# Patient Record
Sex: Female | Born: 1955 | Race: White | Hispanic: No | Marital: Married | Smoking: Never smoker
Health system: Southern US, Community
[De-identification: ages and names within clinical notes are randomized; demographics above are authoritative.]

## PROBLEM LIST (undated history)

## (undated) DIAGNOSIS — Z9889 Other specified postprocedural states: Secondary | ICD-10-CM

## (undated) DIAGNOSIS — G8929 Other chronic pain: Secondary | ICD-10-CM

## (undated) DIAGNOSIS — G629 Polyneuropathy, unspecified: Secondary | ICD-10-CM

---

## 2005-09-23 ENCOUNTER — Inpatient Hospital Stay (HOSPITAL_COMMUNITY): Admission: AD | Admit: 2005-09-23 | Discharge: 2005-09-23 | Payer: Self-pay | Admitting: *Deleted

## 2014-02-25 ENCOUNTER — Other Ambulatory Visit (HOSPITAL_BASED_OUTPATIENT_CLINIC_OR_DEPARTMENT_OTHER): Payer: Self-pay | Admitting: Podiatry

## 2014-02-25 DIAGNOSIS — R52 Pain, unspecified: Secondary | ICD-10-CM

## 2014-02-26 ENCOUNTER — Ambulatory Visit (HOSPITAL_BASED_OUTPATIENT_CLINIC_OR_DEPARTMENT_OTHER)
Admission: RE | Admit: 2014-02-26 | Discharge: 2014-02-26 | Disposition: A | Discharge status: Home / Self Care | Payer: BC Managed Care – PPO | Source: Ambulatory Visit | Attending: Podiatry | Admitting: Podiatry

## 2014-02-26 DIAGNOSIS — R52 Pain, unspecified: Secondary | ICD-10-CM

## 2014-02-26 DIAGNOSIS — M25476 Effusion, unspecified foot: Secondary | ICD-10-CM | POA: Insufficient documentation

## 2014-02-26 DIAGNOSIS — M25473 Effusion, unspecified ankle: Secondary | ICD-10-CM | POA: Diagnosis not present

## 2014-02-26 DIAGNOSIS — M25579 Pain in unspecified ankle and joints of unspecified foot: Secondary | ICD-10-CM | POA: Insufficient documentation

## 2014-04-13 DIAGNOSIS — M19071 Primary osteoarthritis, right ankle and foot: Secondary | ICD-10-CM

## 2017-05-06 ENCOUNTER — Other Ambulatory Visit: Payer: Self-pay | Admitting: Orthopaedic Surgery

## 2017-05-06 DIAGNOSIS — M4726 Other spondylosis with radiculopathy, lumbar region: Secondary | ICD-10-CM

## 2017-05-13 ENCOUNTER — Ambulatory Visit
Admission: RE | Admit: 2017-05-13 | Discharge: 2017-05-13 | Disposition: A | Discharge status: Home / Self Care | Payer: Medicare Other | Source: Ambulatory Visit | Attending: Orthopaedic Surgery | Admitting: Orthopaedic Surgery

## 2017-05-13 DIAGNOSIS — M4726 Other spondylosis with radiculopathy, lumbar region: Secondary | ICD-10-CM

## 2017-05-13 MED ORDER — DIAZEPAM 5 MG PO TABS
10.0000 mg | ORAL_TABLET | Freq: Once | ORAL | Status: AC
  Administered 2017-05-13: 5 mg via ORAL

## 2017-05-13 MED ORDER — IOPAMIDOL (ISOVUE-M 200) INJECTION 41%
15.0000 mL | Freq: Once | INTRAMUSCULAR | Status: AC
  Administered 2017-05-13: 15 mL via INTRATHECAL

## 2017-05-13 NOTE — Discharge Instructions (Signed)

## 2019-08-03 ENCOUNTER — Telehealth: Payer: Self-pay | Admitting: Nurse Practitioner

## 2019-08-03 ENCOUNTER — Other Ambulatory Visit: Payer: Self-pay | Admitting: Rehabilitation

## 2019-08-03 DIAGNOSIS — M5126 Other intervertebral disc displacement, lumbar region: Secondary | ICD-10-CM

## 2019-08-03 NOTE — Telephone Encounter (Signed)
Phone call to patient to verify medication list and allergies for myelogram procedure. Pt aware she will not need to hold any medications for this procedure. Pre and post procedure instructions reviewed with pt. Pt verbalized understanding. 

## 2019-08-11 ENCOUNTER — Ambulatory Visit
Admission: RE | Admit: 2019-08-11 | Discharge: 2019-08-11 | Disposition: A | Discharge status: Home / Self Care | Payer: Medicare Other | Source: Ambulatory Visit | Attending: Rehabilitation | Admitting: Rehabilitation

## 2019-08-11 DIAGNOSIS — M5126 Other intervertebral disc displacement, lumbar region: Secondary | ICD-10-CM

## 2019-08-11 MED ORDER — IOPAMIDOL (ISOVUE-M 200) INJECTION 41%
18.0000 mL | Freq: Once | INTRAMUSCULAR | Status: AC
  Administered 2019-08-11: 11:00:00 18 mL via INTRATHECAL

## 2019-08-11 MED ORDER — DIAZEPAM 5 MG PO TABS
5.0000 mg | ORAL_TABLET | Freq: Once | ORAL | Status: AC
  Administered 2019-08-11: 5 mg via ORAL

## 2019-08-11 NOTE — Discharge Instructions (Signed)

## 2019-09-09 ENCOUNTER — Ambulatory Visit: Payer: Medicare Other | Attending: Internal Medicine

## 2019-09-09 DIAGNOSIS — Z23 Encounter for immunization: Secondary | ICD-10-CM | POA: Insufficient documentation

## 2019-09-09 NOTE — Progress Notes (Signed)
   Covid-19 Vaccination Clinic  Name:  Debra Leonard    MRN: [id number] DOB: [birth year]  09/09/2019  Debra Leonard was observed post Covid-19 immunization for 15 minutes without incident. She was provided with Vaccine Information Sheet and instruction to access the V-Safe system.   Debra Leonard was instructed to call 911 with any severe reactions post vaccine: Marland Kitchen Difficulty breathing  . Swelling of face and throat  . A fast heartbeat  . A bad rash all over body  . Dizziness and weakness   Immunizations Administered    Name Date Dose VIS Date Route   Pfizer COVID-19 Vaccine 09/09/2019 11:53 AM 0.3 mL 06/18/2019 Intramuscular   Manufacturer: [employer], [employer]   Lot: [id number]   NDC: [address]-2

## 2019-10-05 ENCOUNTER — Ambulatory Visit: Payer: Medicare Other | Attending: Internal Medicine

## 2019-10-05 DIAGNOSIS — Z23 Encounter for immunization: Secondary | ICD-10-CM

## 2019-10-05 NOTE — Progress Notes (Signed)
   Covid-19 Vaccination Clinic  Name:  Debra Leonard    MRN: [id number] DOB: 1956/04/11  10/05/2019  Debra Leonard was observed post Covid-19 immunization for 15 minutes without incident. She was provided with Vaccine Information Sheet and instruction to access the V-Safe system.   Debra Leonard was instructed to call 911 with any severe reactions post vaccine: Marland Kitchen Difficulty breathing  . Swelling of face and throat  . A fast heartbeat  . A bad rash all over body  . Dizziness and weakness   Immunizations Administered    Name Date Dose VIS Date Route   Pfizer COVID-19 Vaccine 10/05/2019  2:45 PM 0.3 mL 06/18/2019 Intramuscular   Manufacturer: [employer], [employer]   Lot: [id number]   NDC: [address]-9

## 2021-01-26 ENCOUNTER — Encounter (HOSPITAL_BASED_OUTPATIENT_CLINIC_OR_DEPARTMENT_OTHER): Payer: Self-pay | Admitting: Emergency Medicine

## 2021-01-26 ENCOUNTER — Other Ambulatory Visit: Payer: Self-pay

## 2021-01-26 ENCOUNTER — Emergency Department (HOSPITAL_BASED_OUTPATIENT_CLINIC_OR_DEPARTMENT_OTHER): Payer: Medicare Other

## 2021-01-26 ENCOUNTER — Emergency Department (HOSPITAL_BASED_OUTPATIENT_CLINIC_OR_DEPARTMENT_OTHER)
Admission: EM | Admit: 2021-01-26 | Discharge: 2021-01-26 | Disposition: A | Discharge status: Home / Self Care | Payer: Medicare Other | Attending: Emergency Medicine | Admitting: Emergency Medicine

## 2021-01-26 DIAGNOSIS — M542 Cervicalgia: Secondary | ICD-10-CM | POA: Diagnosis not present

## 2021-01-26 DIAGNOSIS — M25512 Pain in left shoulder: Secondary | ICD-10-CM | POA: Insufficient documentation

## 2021-01-26 DIAGNOSIS — W108XXA Fall (on) (from) other stairs and steps, initial encounter: Secondary | ICD-10-CM | POA: Insufficient documentation

## 2021-01-26 DIAGNOSIS — S0990XA Unspecified injury of head, initial encounter: Secondary | ICD-10-CM | POA: Diagnosis present

## 2021-01-26 DIAGNOSIS — W19XXXA Unspecified fall, initial encounter: Secondary | ICD-10-CM

## 2021-01-26 DIAGNOSIS — S0003XA Contusion of scalp, initial encounter: Secondary | ICD-10-CM | POA: Insufficient documentation

## 2021-01-26 DIAGNOSIS — S50312A Abrasion of left elbow, initial encounter: Secondary | ICD-10-CM | POA: Diagnosis not present

## 2021-01-26 HISTORY — DX: Other specified postprocedural states: Z98.890

## 2021-01-26 HISTORY — DX: Polyneuropathy, unspecified: G62.9

## 2021-01-26 HISTORY — DX: Other chronic pain: G89.29

## 2021-01-26 MED ORDER — TRAMADOL HCL 50 MG PO TABS: 50.0000 mg | ORAL_TABLET | Freq: Four times a day (QID) | ORAL | 0 refills | Status: AC | PRN

## 2021-01-26 MED ORDER — TRAMADOL HCL 50 MG PO TABS
50.0000 mg | ORAL_TABLET | Freq: Once | ORAL | Status: AC
  Administered 2021-01-26: 50 mg via ORAL
  Filled 2021-01-26: qty 1

## 2021-01-26 NOTE — Discharge Instructions (Addendum)
Take tramadol as needed for pain.  Take Tylenol for pain  Your x-rays and CT scan did not show any fractures  See your doctor for follow-up  Return to ER if you have worse headaches, elbow pain, uncontrolled bleeding, vomiting.

## 2021-01-26 NOTE — ED Provider Notes (Signed)
[facility] EMERGENCY DEPARTMENT Provider Note   CSN: [id number] Arrival date & time: 01/26/21  1753     History Chief Complaint  Patient presents with  . Fall    Debra Leonard is a 65 y.o. female history of chronic back pain, here presenting with fall.  Patient was carrying a washer downstairs with a friend and the washer fell apart and she fell down 15 steps.  She did hit her head at that time. She is complaining of left shoulder pain and also abrasion of the left elbow.  No meds prior to arrival.  Patient is on blood thinners.  States that her tetanus is up-to-date  The history is provided by the patient.      Past Medical History:  Diagnosis Date  . Chronic back pain   . History of lithotripsy   . Neuropathy     There are no problems to display for this patient.  OB History   No obstetric history on file.     No family history on file.  Social History   Tobacco Use  . Smoking status: Never    Passive exposure: Never  . Smokeless tobacco: Never  Substance Use Topics  . Alcohol use: Yes    Alcohol/week: 2.0 standard drinks    Types: 2 Cans of beer per week    Comment: weekly  . Drug use: Never    Home Medications Prior to Admission medications   Medication Sig Start Date End Date Taking? Authorizing Provider  acetaminophen (TYLENOL) 500 MG tablet Take 1,000 mg by mouth every 6 (six) hours as needed.    [provider]  estradiol (ESTRACE) 2 MG tablet Take 2 mg by mouth daily.    [provider]  ezetimibe (ZETIA) 10 MG tablet Take 10 mg by mouth daily.    [provider]  gabapentin (NEURONTIN) 800 MG tablet Take 800 mg by mouth 3 (three) times daily.    [provider]  omeprazole (PRILOSEC) 40 MG capsule Take 40 mg by mouth daily.    [provider]    Allergies    Latex, Wasp venom protein, Betadine [povidone iodine], Codeine, Hyoscyamine sulfate, Lipitor [atorvastatin], Shellfish allergy, and Sulfa  antibiotics  Review of Systems   Review of Systems  Musculoskeletal:        L elbow and shoulder pain   Skin:  Positive for wound.  All other systems reviewed and are negative.  Physical Exam Updated Vital Signs BP 124/88   Pulse 68   Temp 98.6 F (37 C) (Oral)   Resp 18   Ht 5\' 3"  (1.6 m)   Wt 74.8 kg   SpO2 95%   BMI 29.23 kg/m   Physical Exam Vitals and nursing note reviewed.  Constitutional:      Comments: Slightly uncomfortable.  HENT:     Head: Normocephalic.     Comments: Bruising on the left scalp with no obvious hematoma or laceration    Nose: Nose normal.     Mouth/Throat:     Mouth: Mucous membranes are moist.  Eyes:     Extraocular Movements: Extraocular movements intact.     Pupils: Pupils are equal, round, and reactive to light.  Neck:     Comments: Mild left paracervical tenderness Cardiovascular:     Rate and Rhythm: Normal rate.     Pulses: Normal pulses.     Heart sounds: Normal heart sounds.  Pulmonary:     Effort: Pulmonary effort is normal.  Breath sounds: Normal breath sounds.  Abdominal:     General: Abdomen is flat.     Palpations: Abdomen is soft.  Musculoskeletal:     Cervical back: Normal range of motion.     Comments: Abrasion of the left elbow.  Mild tenderness over the elbow.  Mild tenderness over the left shoulder but normal range of motion of the shoulder.  No obvious other extremity trauma.  Patient has no midline spinal tenderness  Skin:    General: Skin is warm.     Capillary Refill: Capillary refill takes less than 2 seconds.  Neurological:     General: No focal deficit present.     Mental Status: She is alert and oriented to person, place, and time.  Psychiatric:        Mood and Affect: Mood normal.        Behavior: Behavior normal.    ED Results / Procedures / Treatments   Labs (all labs ordered are listed, but only abnormal results are displayed) Labs Reviewed - No data to display  EKG None  Radiology DG  Ribs Unilateral W/Chest Left  Result Date: 01/26/2021 CLINICAL DATA:  65 year old female with fall and trauma to the left chest wall. EXAM: LEFT RIBS AND CHEST - 3+ VIEW COMPARISON:  None. FINDINGS: The lungs are clear. There is no pleural effusion pneumothorax. The cardiac silhouette is within limits. No acute osseous pathology. No displaced rib fractures. Lower thoracic spinal stimulator. IMPRESSION: Negative. Electronically Signed   By: Elgie CollardArash  Radparvar M.D.   On: 01/26/2021 19:18   DG Elbow Complete Left  Result Date: 01/26/2021 CLINICAL DATA:  65 year old female with fall and trauma to the left upper extremity. EXAM: LEFT SHOULDER - 2+ VIEW; LEFT ELBOW - COMPLETE 3+ VIEW COMPARISON:  Chest radiograph dated 01/26/2021. FINDINGS: There is no evidence of fracture or dislocation. There is no evidence of arthropathy or other focal bone abnormality. Soft tissues are unremarkable. IMPRESSION: Negative. Electronically Signed   By: Elgie CollardArash  Radparvar M.D.   On: 01/26/2021 19:17   CT Head Wo Contrast  Result Date: 01/26/2021 CLINICAL DATA:  Facial trauma.  Fall down concrete steps. EXAM: CT HEAD WITHOUT CONTRAST CT MAXILLOFACIAL WITHOUT CONTRAST CT CERVICAL SPINE WITHOUT CONTRAST TECHNIQUE: Multidetector CT imaging of the head, cervical spine, and maxillofacial structures were performed using the standard protocol without intravenous contrast. Multiplanar CT image reconstructions of the cervical spine and maxillofacial structures were also generated. COMPARISON:  Thoracic spine CT 10/28/2017 FINDINGS: CT HEAD FINDINGS Brain: There is no evidence of an acute infarct, intracranial hemorrhage, mass, midline shift, or extra-axial fluid collection. The ventricles and sulci are normal. Vascular: Mild calcified atherosclerosis at the skull base. No hyperdense vessel. Skull: No fracture or suspicious osseous lesion. Other: None. CT MAXILLOFACIAL FINDINGS Osseous: No acute fracture, mandibular dislocation, or destructive  osseous process. Orbits: Unremarkable. Sinuses: Paranasal sinuses and mastoid air cells are clear. Soft tissues: Unremarkable. CT CERVICAL SPINE FINDINGS Alignment: Cervical spine straightening.  No traumatic subluxation. Skull base and vertebrae: No acute fracture or suspicious osseous lesion. 5 mm lucent focus in the C7 vertebral body, unchanged from 2019 and likely benign. Soft tissues and spinal canal: No prevertebral fluid or swelling. No visible canal hematoma. Disc levels: Moderate disc degeneration at C5-6 asymmetric to the right and at C6-7 asymmetric to the left. Moderate right neural foraminal stenosis at C5-6 and mild left neural foraminal stenosis at C6-7 due to uncovertebral spurring. No evidence of significant spinal stenosis. Other: None. IMPRESSION: 1. Negative  head CT. 2. No acute maxillofacial or cervical spine fracture. Electronically Signed   By: Sebastian Ache M.D.   On: 01/26/2021 19:06   CT Cervical Spine Wo Contrast  Result Date: 01/26/2021 CLINICAL DATA:  Facial trauma.  Fall down concrete steps. EXAM: CT HEAD WITHOUT CONTRAST CT MAXILLOFACIAL WITHOUT CONTRAST CT CERVICAL SPINE WITHOUT CONTRAST TECHNIQUE: Multidetector CT imaging of the head, cervical spine, and maxillofacial structures were performed using the standard protocol without intravenous contrast. Multiplanar CT image reconstructions of the cervical spine and maxillofacial structures were also generated. COMPARISON:  Thoracic spine CT 10/28/2017 FINDINGS: CT HEAD FINDINGS Brain: There is no evidence of an acute infarct, intracranial hemorrhage, mass, midline shift, or extra-axial fluid collection. The ventricles and sulci are normal. Vascular: Mild calcified atherosclerosis at the skull base. No hyperdense vessel. Skull: No fracture or suspicious osseous lesion. Other: None. CT MAXILLOFACIAL FINDINGS Osseous: No acute fracture, mandibular dislocation, or destructive osseous process. Orbits: Unremarkable. Sinuses: Paranasal  sinuses and mastoid air cells are clear. Soft tissues: Unremarkable. CT CERVICAL SPINE FINDINGS Alignment: Cervical spine straightening.  No traumatic subluxation. Skull base and vertebrae: No acute fracture or suspicious osseous lesion. 5 mm lucent focus in the C7 vertebral body, unchanged from 2019 and likely benign. Soft tissues and spinal canal: No prevertebral fluid or swelling. No visible canal hematoma. Disc levels: Moderate disc degeneration at C5-6 asymmetric to the right and at C6-7 asymmetric to the left. Moderate right neural foraminal stenosis at C5-6 and mild left neural foraminal stenosis at C6-7 due to uncovertebral spurring. No evidence of significant spinal stenosis. Other: None. IMPRESSION: 1. Negative head CT. 2. No acute maxillofacial or cervical spine fracture. Electronically Signed   By: Sebastian Ache M.D.   On: 01/26/2021 19:06   DG Shoulder Left  Result Date: 01/26/2021 CLINICAL DATA:  65 year old female with fall and trauma to the left upper extremity. EXAM: LEFT SHOULDER - 2+ VIEW; LEFT ELBOW - COMPLETE 3+ VIEW COMPARISON:  Chest radiograph dated 01/26/2021. FINDINGS: There is no evidence of fracture or dislocation. There is no evidence of arthropathy or other focal bone abnormality. Soft tissues are unremarkable. IMPRESSION: Negative. Electronically Signed   By: Elgie Collard M.D.   On: 01/26/2021 19:17   CT Maxillofacial Wo Contrast  Result Date: 01/26/2021 CLINICAL DATA:  Facial trauma.  Fall down concrete steps. EXAM: CT HEAD WITHOUT CONTRAST CT MAXILLOFACIAL WITHOUT CONTRAST CT CERVICAL SPINE WITHOUT CONTRAST TECHNIQUE: Multidetector CT imaging of the head, cervical spine, and maxillofacial structures were performed using the standard protocol without intravenous contrast. Multiplanar CT image reconstructions of the cervical spine and maxillofacial structures were also generated. COMPARISON:  Thoracic spine CT 10/28/2017 FINDINGS: CT HEAD FINDINGS Brain: There is no evidence  of an acute infarct, intracranial hemorrhage, mass, midline shift, or extra-axial fluid collection. The ventricles and sulci are normal. Vascular: Mild calcified atherosclerosis at the skull base. No hyperdense vessel. Skull: No fracture or suspicious osseous lesion. Other: None. CT MAXILLOFACIAL FINDINGS Osseous: No acute fracture, mandibular dislocation, or destructive osseous process. Orbits: Unremarkable. Sinuses: Paranasal sinuses and mastoid air cells are clear. Soft tissues: Unremarkable. CT CERVICAL SPINE FINDINGS Alignment: Cervical spine straightening.  No traumatic subluxation. Skull base and vertebrae: No acute fracture or suspicious osseous lesion. 5 mm lucent focus in the C7 vertebral body, unchanged from 2019 and likely benign. Soft tissues and spinal canal: No prevertebral fluid or swelling. No visible canal hematoma. Disc levels: Moderate disc degeneration at C5-6 asymmetric to the right and at C6-7 asymmetric to  the left. Moderate right neural foraminal stenosis at C5-6 and mild left neural foraminal stenosis at C6-7 due to uncovertebral spurring. No evidence of significant spinal stenosis. Other: None. IMPRESSION: 1. Negative head CT. 2. No acute maxillofacial or cervical spine fracture. Electronically Signed   By: Sebastian Ache M.D.   On: 01/26/2021 19:06    Procedures Procedures   Medications Ordered in ED Medications  traMADol (ULTRAM) tablet 50 mg (50 mg Oral Given 01/26/21 1823)    ED Course  I have reviewed the triage vital signs and the nursing notes.  Pertinent labs & imaging results that were available during my care of the patient were reviewed by me and considered in my medical decision making (see chart for details).    MDM Rules/Calculators/A&P                           Messiah Rovira is a 65 y.o. female here with fall down 15 steps.  Patient has an abrasion of the left elbow.  Patient also had head injury at that time.  Patient otherwise has no signs of chest or  abdominal trauma.  We will get CT head and left upper extremity x-ray  8:39 PM CT did not show any fractures or bleed.  X-rays unremarkable.  Felt better after tramadol.  Stable for discharge  Final Clinical Impression(s) / ED Diagnoses Final diagnoses:  None    Rx / DC Orders ED Discharge Orders     None        Charlynne Pander, MD 01/26/21 2040

## 2021-01-26 NOTE — ED Triage Notes (Signed)
Reports she fell down 15 concrete steps.  Landed on left elbow.  C/o pain to left elbow and left shoulder.  Also has abrasion to right leg.  Ambulatory in triage.  Denies any LOC.

## 2021-01-26 NOTE — ED Provider Notes (Signed)
Emergency Medicine Provider Triage Evaluation Note  Debra Leonard , a 65 y.o. female  was evaluated in triage.  Pt complains of fall.  Patient states that she was carrying a washer downstairs with a friend and the washer fell apart and she fell down about 15 steps.  She states that she is having pain in the left shoulder and elbow area.  Also hit her head and complains of some blurry vision  Review of Systems  Positive: Blurry vision, L elbow pain  Negative: Back pain, chest pain   Physical Exam  BP 129/82 (BP Location: Right Arm)   Pulse 84   Temp 98.6 F (37 C) (Oral)   Resp 18   Ht 5\' 3"  (1.6 m)   Wt 74.8 kg   SpO2 97%   BMI 29.23 kg/m  Gen:   Awake, no distress   Resp:  Normal effort  MSK:   Bruising of the left elbow area, tenderness to the left shoulder but able to range it.  Abrasion to the right leg Other:    Medical Decision Making  Medically screening exam initiated at 6:22 PM.  Appropriate orders placed.  [name] was informed that the remainder of the evaluation will be completed by another provider, this initial triage assessment does not replace that evaluation, and the importance of remaining in the ED until their evaluation is complete.  Patient is here with fall.  Will get CT head neck and face and also left upper extremity x-rays.  No spinal tenderness. Ambulatory    [name], MD 01/26/21 01/28/21

## 2022-02-07 ENCOUNTER — Other Ambulatory Visit: Payer: Self-pay | Admitting: Rehabilitation

## 2022-02-07 DIAGNOSIS — G894 Chronic pain syndrome: Secondary | ICD-10-CM

## 2022-02-07 DIAGNOSIS — M546 Pain in thoracic spine: Secondary | ICD-10-CM

## 2022-02-14 ENCOUNTER — Ambulatory Visit
Admission: RE | Admit: 2022-02-14 | Discharge: 2022-02-14 | Disposition: A | Discharge status: Home / Self Care | Payer: Medicare Other | Source: Ambulatory Visit | Attending: Rehabilitation | Admitting: Rehabilitation

## 2022-02-14 DIAGNOSIS — G894 Chronic pain syndrome: Secondary | ICD-10-CM

## 2022-02-14 DIAGNOSIS — M546 Pain in thoracic spine: Secondary | ICD-10-CM

## 2022-02-14 MED ORDER — IOPAMIDOL (ISOVUE-M 300) INJECTION 61%
10.0000 mL | Freq: Once | INTRAMUSCULAR | Status: AC
  Administered 2022-02-14: 10 mL via INTRATHECAL

## 2022-02-14 MED ORDER — MEPERIDINE HCL 50 MG/ML IJ SOLN: 50.0000 mg | Freq: Once | INTRAMUSCULAR | Status: DC | PRN

## 2022-02-14 MED ORDER — DIAZEPAM 5 MG PO TABS
5.0000 mg | ORAL_TABLET | Freq: Once | ORAL | Status: AC
  Administered 2022-02-14: 5 mg via ORAL

## 2022-02-14 MED ORDER — ONDANSETRON HCL 4 MG/2ML IJ SOLN: 4.0000 mg | Freq: Once | INTRAMUSCULAR | Status: DC | PRN

## 2022-02-14 MED ORDER — ACETAMINOPHEN 500 MG PO TABS
1000.0000 mg | ORAL_TABLET | Freq: Once | ORAL | Status: AC
  Administered 2022-02-14: 1000 mg via ORAL

## 2022-02-14 NOTE — Discharge Instr - Other Orders (Addendum)
11:29 Pain 8/10 post myelogram procedure, see MAR. 1,000 mg Tylenol given orally per pt request.

## 2022-02-14 NOTE — Discharge Instructions (Signed)

## 2022-02-14 NOTE — Progress Notes (Signed)
Pt reports her spinal cord stimulator has been placed in procedure mode for myelogram procedure.

## 2022-03-19 IMAGING — CT CT MAXILLOFACIAL W/O CM
3 series · 14 of 47 positions shown, 16 images · non-contrast
Comparison: Thoracic spine CT 10/28/2017

CLINICAL DATA: Facial trauma.  Fall down concrete steps.

EXAM:
CT HEAD WITHOUT CONTRAST
CT MAXILLOFACIAL WITHOUT CONTRAST
CT CERVICAL SPINE WITHOUT CONTRAST
TECHNIQUE: Multidetector CT imaging of the head, cervical spine, and
maxillofacial structures were performed using the standard protocol
without intravenous contrast. Multiplanar CT image reconstructions
of the cervical spine and maxillofacial structures were also
generated.

[Series 2: max soft · axial · 0.34mm/px · z∈[-266,-136]mm · 8 of 77 slices shown, 10 images]
[im 6/77  brain]
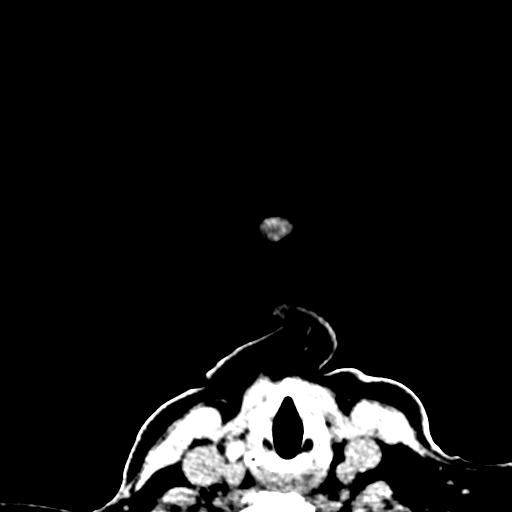
[im 6/77  bone]
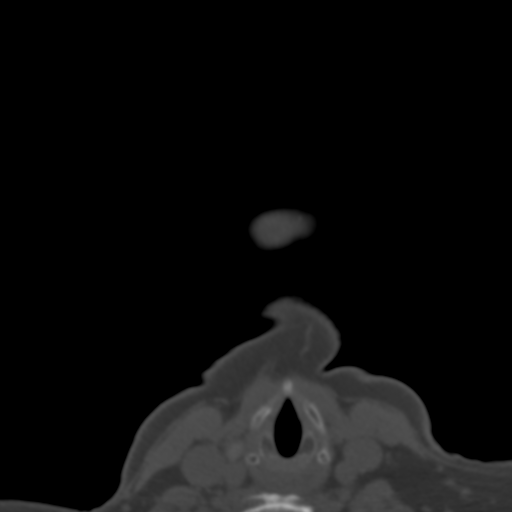
[im 16/77  bone]
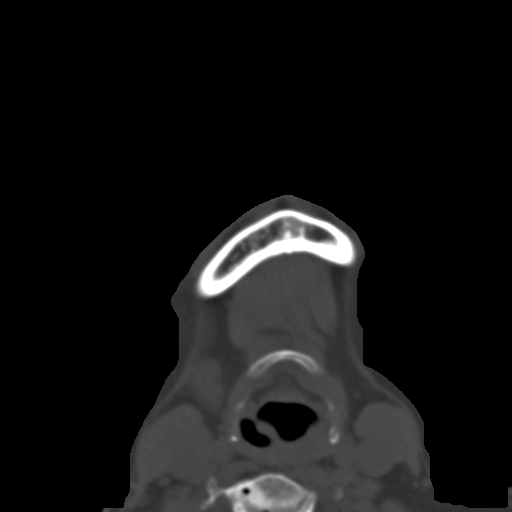
[im 24/77  bone]
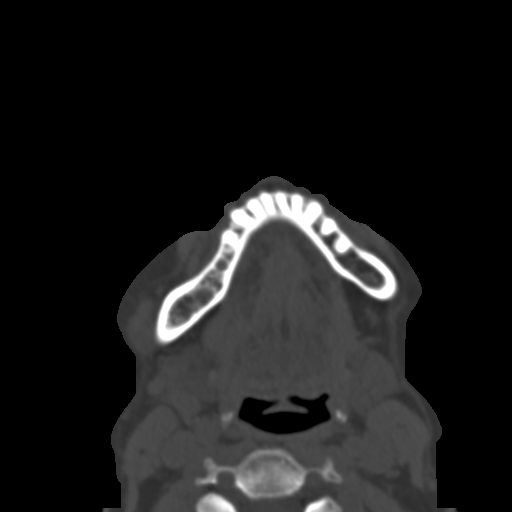
[im 35/77  bone]
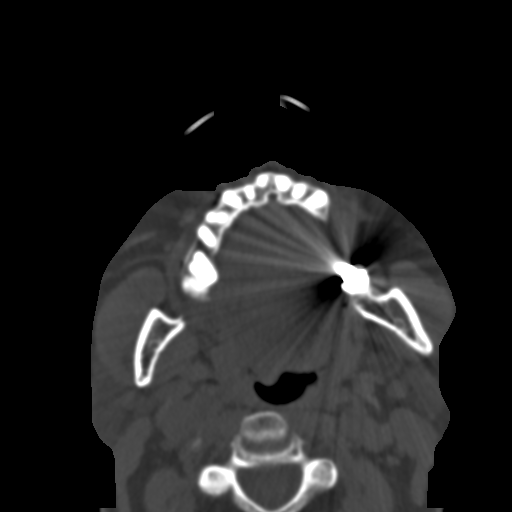
[im 42/77  brain]
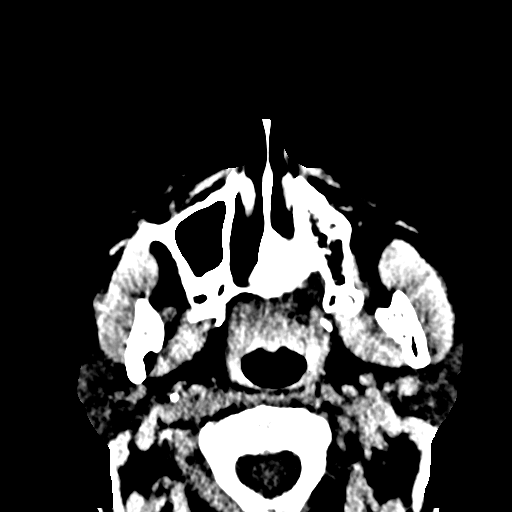
[im 42/77  bone]
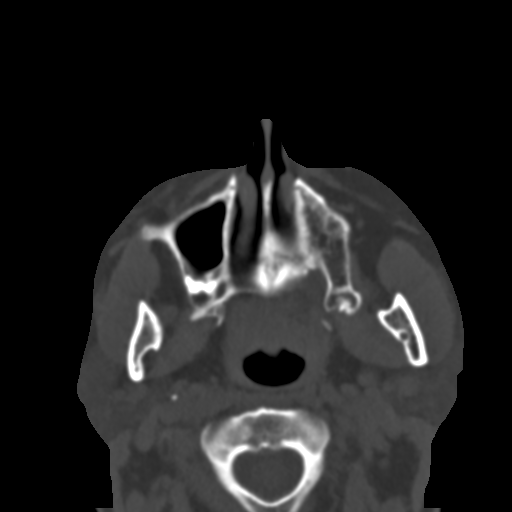
[im 53/77  bone]
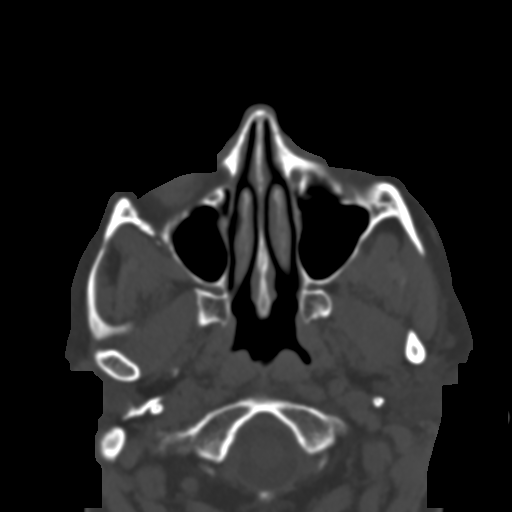
[im 61/77  bone]
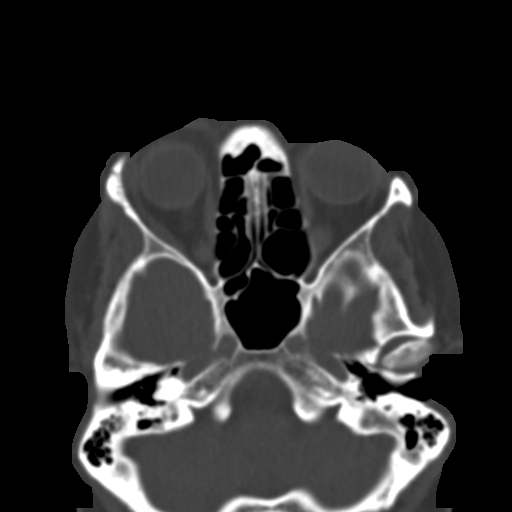
[im 71/77  bone]
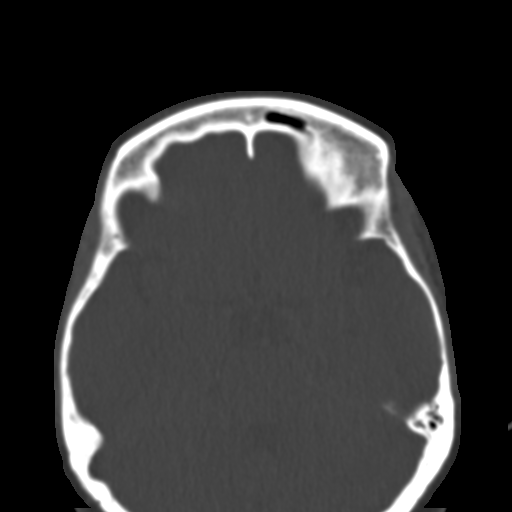

[Series 6: coronal soft · coronal · 0.34mm/px · 3 of 68 slices shown]
[im 23/68  bone]
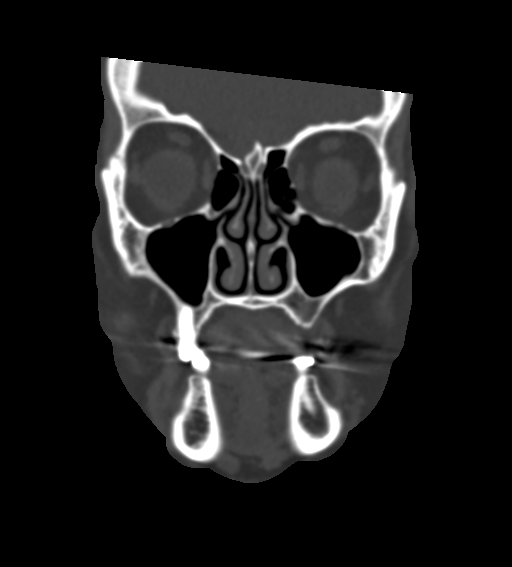
[im 30/68  bone]
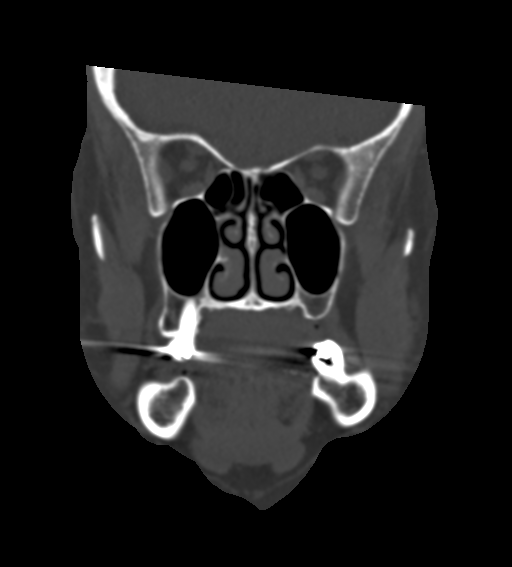
[im 38/68  bone]
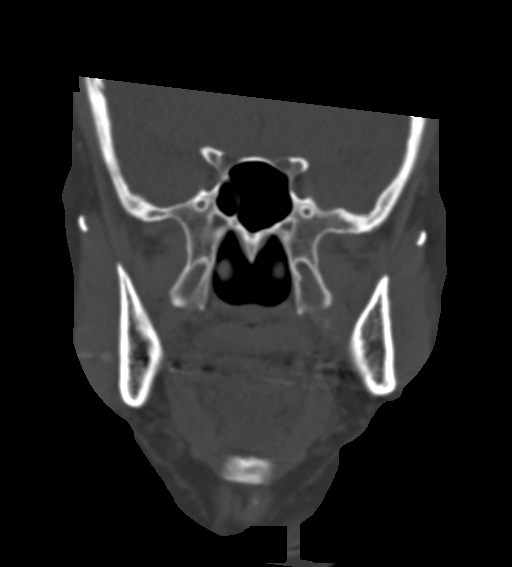

[Series 8: sagittal soft · sagittal · 0.26mm/px · 3 of 79 slices shown]
[im 27/79  bone]
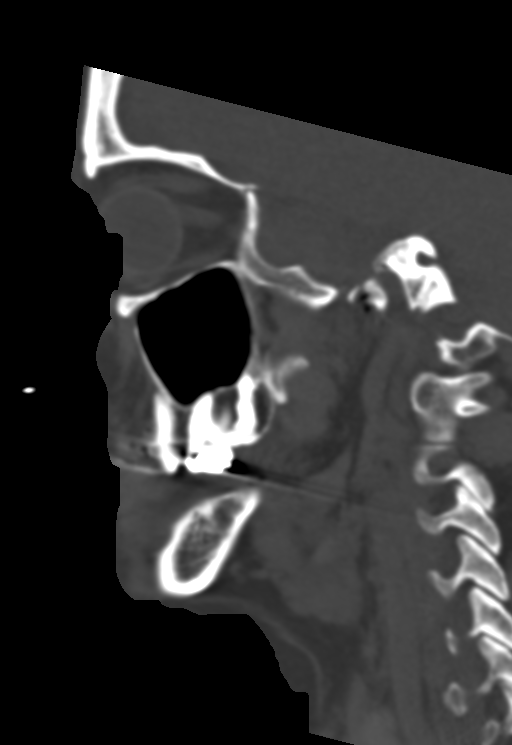
[im 40/79  bone]
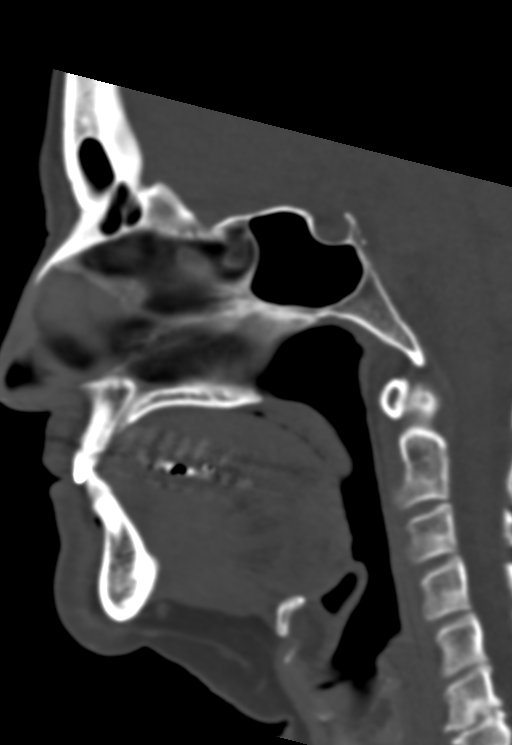
[im 53/79  bone]
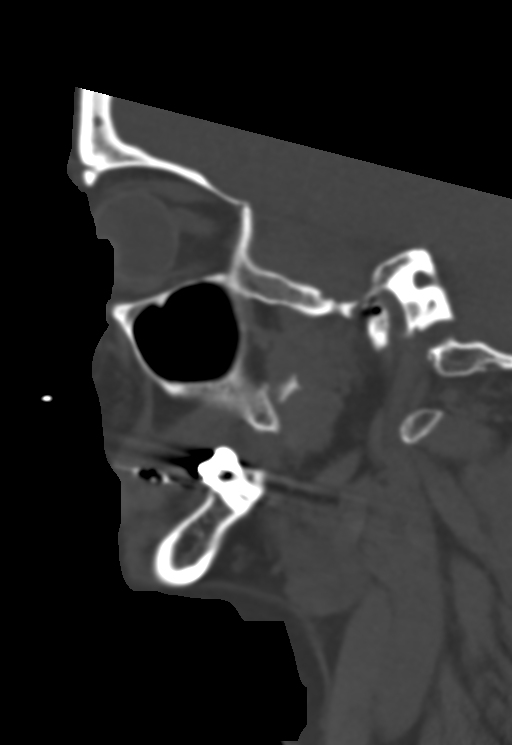

[14 of 47 positions shown; findings below may reference images not displayed]

FINDINGS: CT HEAD FINDINGS

Brain: There is no evidence of an acute infarct, intracranial
hemorrhage, mass, midline shift, or extra-axial fluid collection.
The ventricles and sulci are normal.

Vascular: Mild calcified atherosclerosis at the skull base. No
hyperdense vessel.

Skull: No fracture or suspicious osseous lesion.

Other: None.

CT MAXILLOFACIAL FINDINGS

Osseous: No acute fracture, mandibular dislocation, or destructive
osseous process.

Orbits: Unremarkable.

Sinuses: Paranasal sinuses and mastoid air cells are clear.

Soft tissues: Unremarkable.

CT CERVICAL SPINE FINDINGS

Alignment: Cervical spine straightening.  No traumatic subluxation.

Skull base and vertebrae: No acute fracture or suspicious osseous
lesion. 5 mm lucent focus in the C7 vertebral body, unchanged from
3025 and likely benign.

Soft tissues and spinal canal: No prevertebral fluid or swelling. No
visible canal hematoma.

Disc levels: Moderate disc degeneration at C5-6 asymmetric to the
right and at C6-7 asymmetric to the left. Moderate right neural
foraminal stenosis at C5-6 and mild left neural foraminal stenosis
at C6-7 due to uncovertebral spurring. No evidence of significant
spinal stenosis.

Other: None.
IMPRESSION: 1. Negative head CT.
2. No acute maxillofacial or cervical spine fracture.

## 2022-03-19 IMAGING — CT CT CERVICAL SPINE W/O CM
3 of 4 series · 10 of 33 positions shown, 12 images · non-contrast
Comparison: Thoracic spine CT 10/28/2017

CLINICAL DATA: Facial trauma.  Fall down concrete steps.

EXAM:
CT HEAD WITHOUT CONTRAST
CT MAXILLOFACIAL WITHOUT CONTRAST
CT CERVICAL SPINE WITHOUT CONTRAST
TECHNIQUE: Multidetector CT imaging of the head, cervical spine, and
maxillofacial structures were performed using the standard protocol
without intravenous contrast. Multiplanar CT image reconstructions
of the cervical spine and maxillofacial structures were also
generated.

[Series 5: sagittal bone · sagittal · 0.31mm/px · 5 of 59 slices shown, 6 images]
[im 20/59  bone]
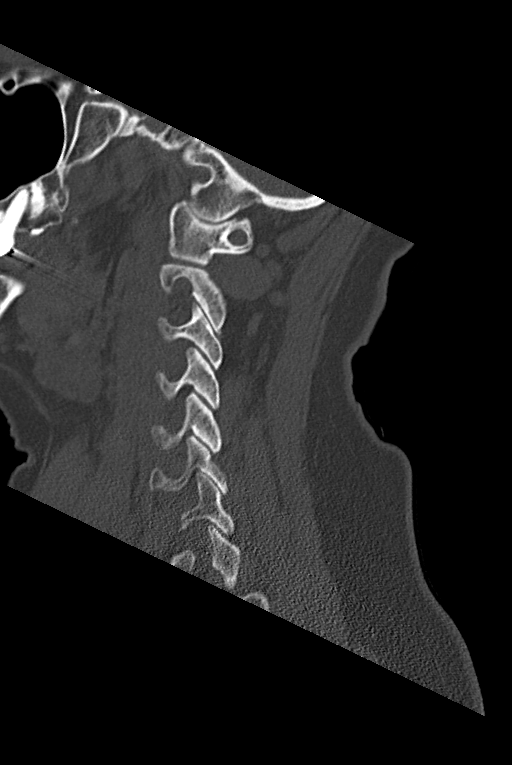
[im 25/59  bone]
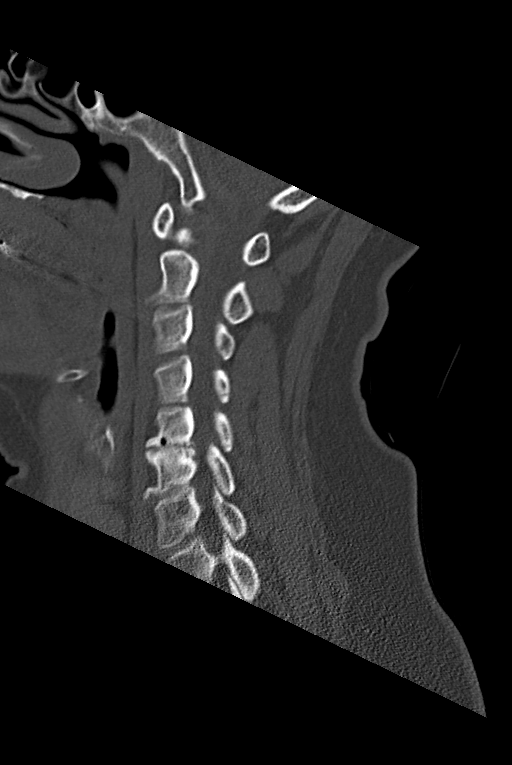
[im 30/59  soft-tissue]
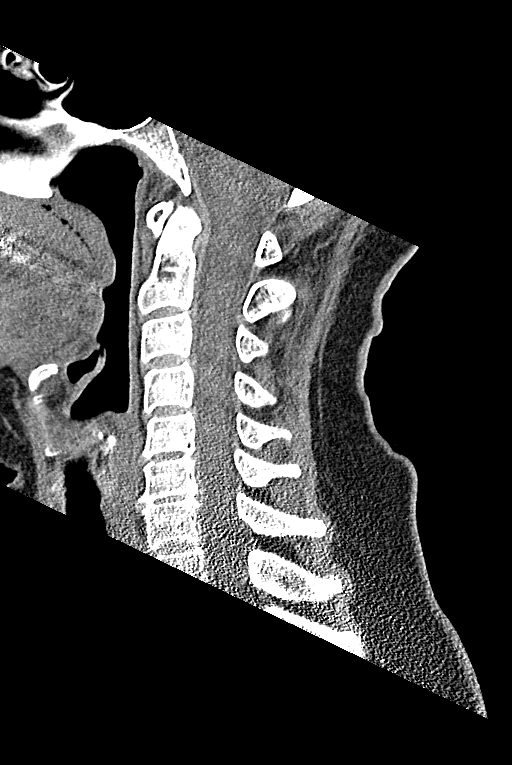
[im 30/59  bone]
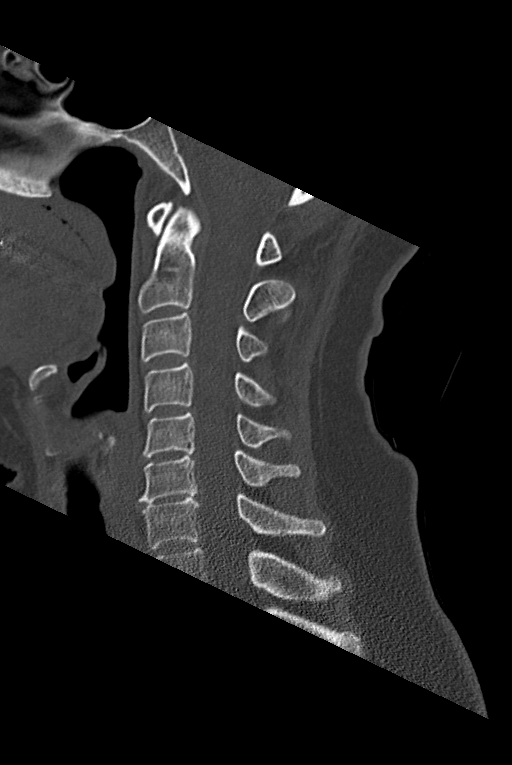
[im 34/59  bone]
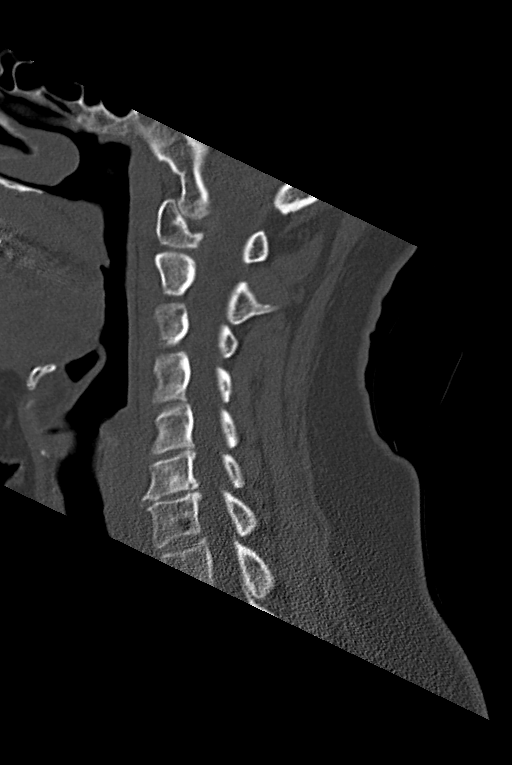
[im 39/59  bone]
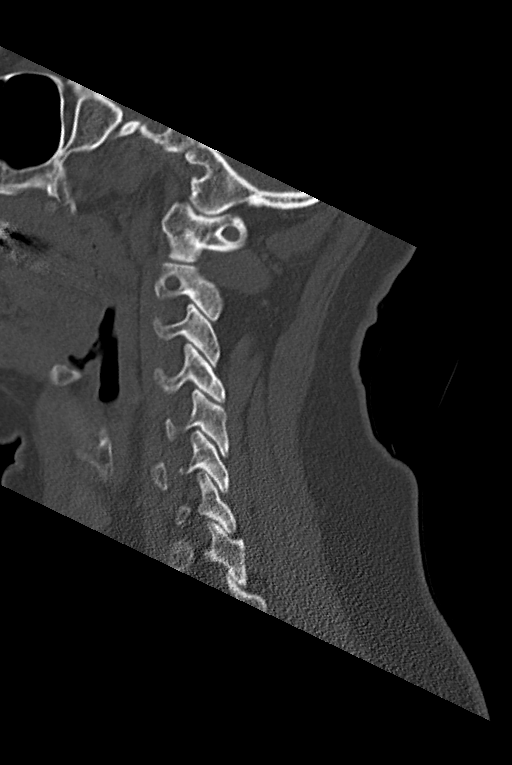

[Series 6: coronal bone · coronal · 0.23mm/px · 3 of 63 slices shown]
[im 15/63  bone]
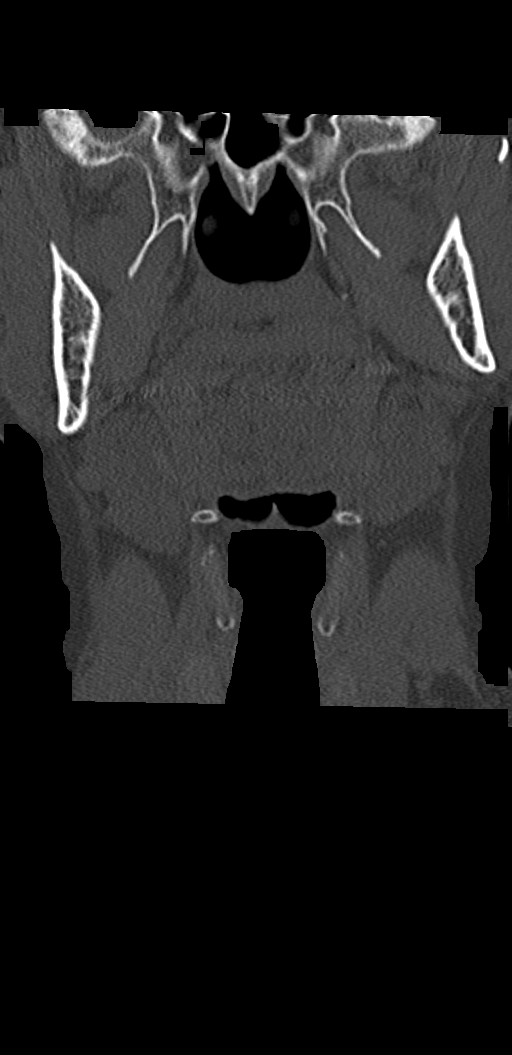
[im 26/63  bone]
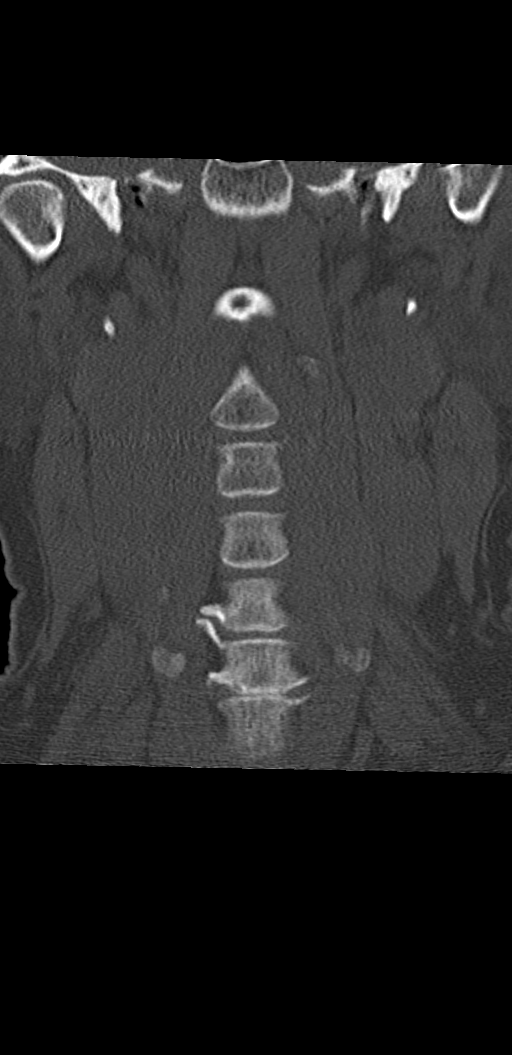
[im 37/63  bone]
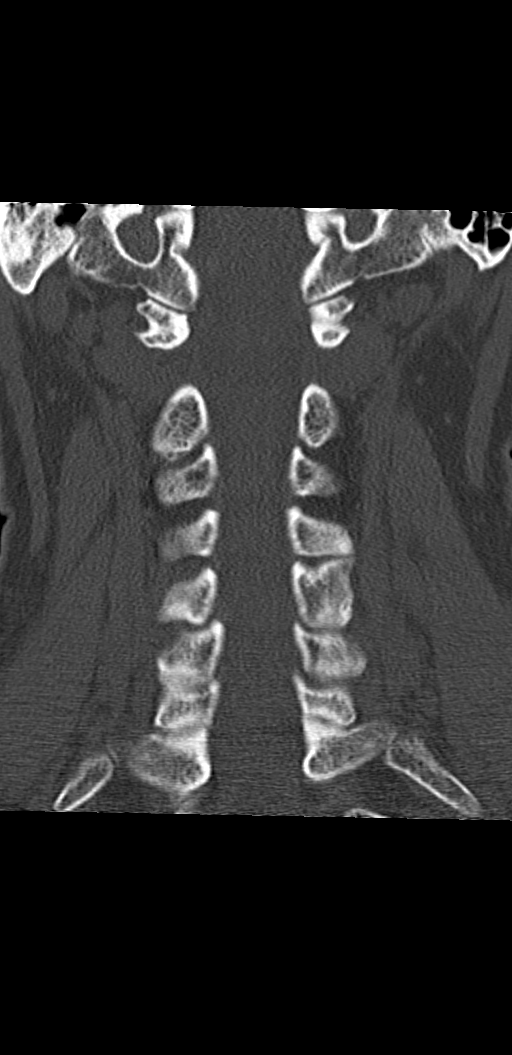

[Series 7: orthogonal axials · axial · 0.23mm/px · z∈[-288,-227]mm · 2 of 80 slices shown, 3 images]
[im 23/80  soft-tissue]
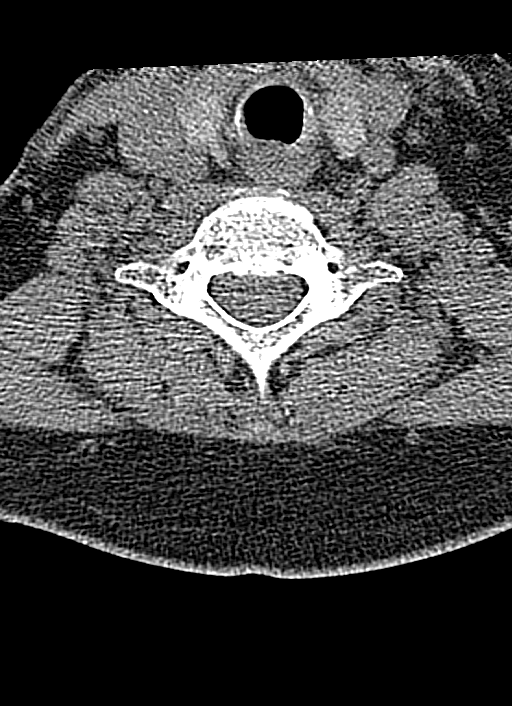
[im 23/80  bone]
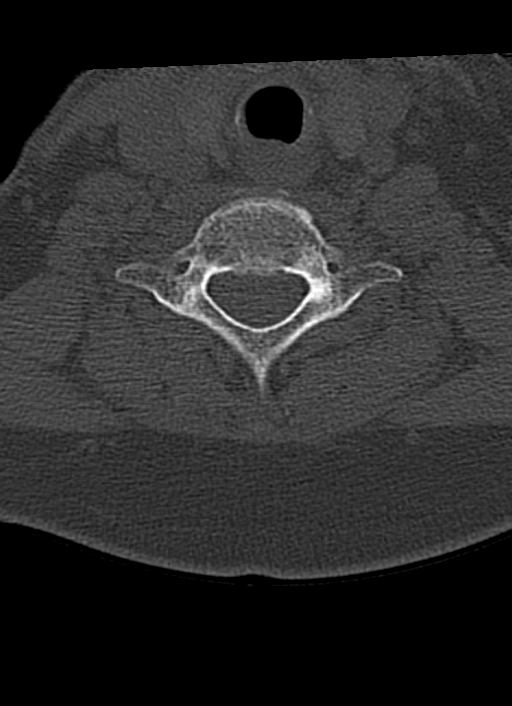
[im 57/80  bone]
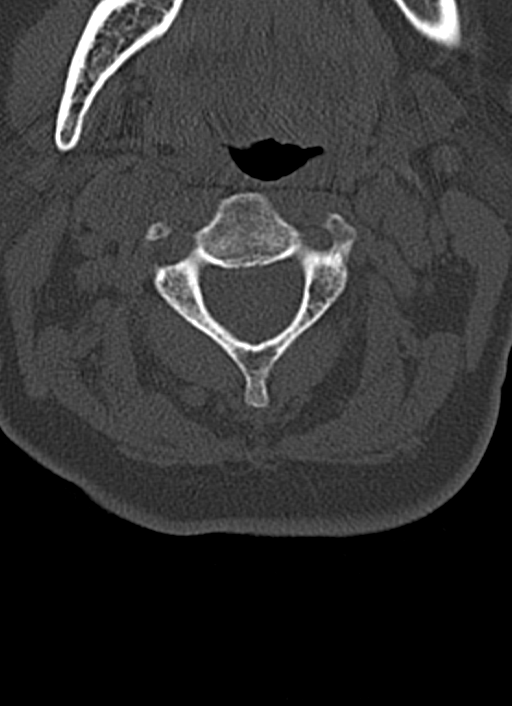

[10 of 33 positions shown; findings below may reference images not displayed]

FINDINGS: CT HEAD FINDINGS

Brain: There is no evidence of an acute infarct, intracranial
hemorrhage, mass, midline shift, or extra-axial fluid collection.
The ventricles and sulci are normal.

Vascular: Mild calcified atherosclerosis at the skull base. No
hyperdense vessel.

Skull: No fracture or suspicious osseous lesion.

Other: None.

CT MAXILLOFACIAL FINDINGS

Osseous: No acute fracture, mandibular dislocation, or destructive
osseous process.

Orbits: Unremarkable.

Sinuses: Paranasal sinuses and mastoid air cells are clear.

Soft tissues: Unremarkable.

CT CERVICAL SPINE FINDINGS

Alignment: Cervical spine straightening.  No traumatic subluxation.

Skull base and vertebrae: No acute fracture or suspicious osseous
lesion. 5 mm lucent focus in the C7 vertebral body, unchanged from
3025 and likely benign.

Soft tissues and spinal canal: No prevertebral fluid or swelling. No
visible canal hematoma.

Disc levels: Moderate disc degeneration at C5-6 asymmetric to the
right and at C6-7 asymmetric to the left. Moderate right neural
foraminal stenosis at C5-6 and mild left neural foraminal stenosis
at C6-7 due to uncovertebral spurring. No evidence of significant
spinal stenosis.

Other: None.
IMPRESSION: 1. Negative head CT.
2. No acute maxillofacial or cervical spine fracture.

## 2023-03-07 ENCOUNTER — Other Ambulatory Visit: Payer: Self-pay | Admitting: Physician Assistant

## 2023-03-07 DIAGNOSIS — M4326 Fusion of spine, lumbar region: Secondary | ICD-10-CM

## 2023-03-12 NOTE — Discharge Instructions (Signed)

## 2023-03-13 ENCOUNTER — Ambulatory Visit
Admission: RE | Admit: 2023-03-13 | Discharge: 2023-03-13 | Disposition: A | Discharge status: Home / Self Care | Payer: Medicare Other | Source: Ambulatory Visit | Attending: Physician Assistant | Admitting: Physician Assistant

## 2023-03-13 DIAGNOSIS — M4326 Fusion of spine, lumbar region: Secondary | ICD-10-CM

## 2023-03-13 MED ORDER — DIAZEPAM 5 MG PO TABS
5.0000 mg | ORAL_TABLET | Freq: Once | ORAL | Status: AC
  Administered 2023-03-13: 5 mg via ORAL

## 2023-03-13 MED ORDER — IOPAMIDOL (ISOVUE-M 200) INJECTION 41%
20.0000 mL | Freq: Once | INTRAMUSCULAR | Status: AC
  Administered 2023-03-13: 20 mL via INTRATHECAL

## 2023-03-13 MED ORDER — ONDANSETRON HCL 4 MG/2ML IJ SOLN
4.0000 mg | Freq: Once | INTRAMUSCULAR | Status: AC | PRN
  Administered 2023-03-13: 4 mg via INTRAMUSCULAR

## 2023-03-13 MED ORDER — MEPERIDINE HCL 50 MG/ML IJ SOLN
50.0000 mg | Freq: Once | INTRAMUSCULAR | Status: AC | PRN
  Administered 2023-03-13: 50 mg via INTRAMUSCULAR

## 2023-03-13 NOTE — Discharge Instr - Other Info (Addendum)
1012: pt reports pain 9/10 from myelo. See MAR 1040: pt reports relief in pain 4/10. Pt will continue to rest in nursing area until d/c time.

## 2024-04-28 ENCOUNTER — Other Ambulatory Visit: Payer: Self-pay

## 2024-04-28 DIAGNOSIS — Z981 Arthrodesis status: Secondary | ICD-10-CM

## 2024-04-28 DIAGNOSIS — M47816 Spondylosis without myelopathy or radiculopathy, lumbar region: Secondary | ICD-10-CM

## 2024-04-28 DIAGNOSIS — M5416 Radiculopathy, lumbar region: Secondary | ICD-10-CM

## 2024-05-17 ENCOUNTER — Telehealth: Payer: Self-pay

## 2024-05-17 NOTE — Progress Notes (Signed)
 See telephone note

## 2024-05-17 NOTE — Discharge Instructions (Signed)

## 2024-05-18 ENCOUNTER — Ambulatory Visit
Admission: RE | Admit: 2024-05-18 | Discharge: 2024-05-18 | Disposition: A | Discharge status: Home / Self Care | Source: Ambulatory Visit

## 2024-05-18 DIAGNOSIS — M5416 Radiculopathy, lumbar region: Secondary | ICD-10-CM

## 2024-05-18 DIAGNOSIS — Z981 Arthrodesis status: Secondary | ICD-10-CM

## 2024-05-18 DIAGNOSIS — M47816 Spondylosis without myelopathy or radiculopathy, lumbar region: Secondary | ICD-10-CM

## 2024-05-18 MED ORDER — ONDANSETRON HCL 4 MG/2ML IJ SOLN: 4.0000 mg | Freq: Once | INTRAMUSCULAR | Status: DC | PRN

## 2024-05-18 MED ORDER — OXYCODONE-ACETAMINOPHEN 5-325 MG PO TABS
1.0000 | ORAL_TABLET | Freq: Once | ORAL | Status: AC
  Administered 2024-05-18: 1 via ORAL

## 2024-05-18 MED ORDER — IOPAMIDOL (ISOVUE-M 200) INJECTION 41%
18.0000 mL | Freq: Once | INTRAMUSCULAR | Status: AC
  Administered 2024-05-18: 18 mL via INTRATHECAL

## 2024-05-18 MED ORDER — DIAZEPAM 5 MG PO TABS
5.0000 mg | ORAL_TABLET | Freq: Once | ORAL | Status: AC
  Administered 2024-05-18: 5 mg via ORAL

## 2024-05-18 MED ORDER — MEPERIDINE HCL 50 MG/ML IJ SOLN: 50.0000 mg | Freq: Once | INTRAMUSCULAR | Status: DC | PRN

## 2024-07-29 ENCOUNTER — Other Ambulatory Visit (HOSPITAL_COMMUNITY): Payer: Self-pay
